# Patient Record
Sex: Male | Born: 1970 | Race: White | Hispanic: No | Smoking: Current some day smoker
Health system: Southern US, Community
[De-identification: ages and names within clinical notes are randomized; demographics above are authoritative.]

## PROBLEM LIST (undated history)

## (undated) DIAGNOSIS — E785 Hyperlipidemia, unspecified: Secondary | ICD-10-CM

## (undated) DIAGNOSIS — Z9889 Other specified postprocedural states: Secondary | ICD-10-CM

## (undated) HISTORY — DX: Other specified postprocedural states: Z98.890

## (undated) HISTORY — PX: OTHER SURGICAL HISTORY: SHX169

## (undated) HISTORY — PX: ABDOMINAL HERNIA REPAIR: SHX539

## (undated) HISTORY — DX: Hyperlipidemia, unspecified: E78.5

## (undated) HISTORY — PX: ACHILLES TENDON REPAIR: SUR1153

## (undated) HISTORY — PX: EYE SURGERY: SHX253

---

## 1997-12-28 ENCOUNTER — Encounter: Admission: RE | Admit: 1997-12-28 | Discharge: 1997-12-28 | Payer: Self-pay | Admitting: Family Medicine

## 1999-09-27 ENCOUNTER — Encounter: Admission: RE | Admit: 1999-09-27 | Discharge: 1999-09-27 | Payer: Self-pay | Admitting: Family Medicine

## 2000-02-29 ENCOUNTER — Encounter: Admission: RE | Admit: 2000-02-29 | Discharge: 2000-02-29 | Payer: Self-pay | Admitting: Family Medicine

## 2000-03-28 ENCOUNTER — Encounter: Admission: RE | Admit: 2000-03-28 | Discharge: 2000-03-28 | Payer: Self-pay | Admitting: Family Medicine

## 2000-04-21 ENCOUNTER — Encounter: Admission: RE | Admit: 2000-04-21 | Discharge: 2000-04-21 | Payer: Self-pay | Admitting: Family Medicine

## 2002-02-05 ENCOUNTER — Encounter: Admission: RE | Admit: 2002-02-05 | Discharge: 2002-02-05 | Payer: Self-pay | Admitting: Family Medicine

## 2002-04-06 ENCOUNTER — Encounter: Admission: RE | Admit: 2002-04-06 | Discharge: 2002-04-06 | Payer: Self-pay | Admitting: Family Medicine

## 2002-04-08 ENCOUNTER — Encounter: Admission: RE | Admit: 2002-04-08 | Discharge: 2002-04-08 | Payer: Self-pay | Admitting: Sports Medicine

## 2005-12-09 ENCOUNTER — Ambulatory Visit: Payer: Self-pay | Admitting: Family Medicine

## 2006-04-22 HISTORY — PX: EYE SURGERY: SHX253

## 2006-06-19 DIAGNOSIS — Z87891 Personal history of nicotine dependence: Secondary | ICD-10-CM

## 2011-12-10 DIAGNOSIS — Z9889 Other specified postprocedural states: Secondary | ICD-10-CM

## 2011-12-10 HISTORY — DX: Other specified postprocedural states: Z98.890

## 2011-12-31 ENCOUNTER — Encounter (INDEPENDENT_AMBULATORY_CARE_PROVIDER_SITE_OTHER): Payer: Self-pay | Admitting: Surgery

## 2012-01-02 ENCOUNTER — Encounter (INDEPENDENT_AMBULATORY_CARE_PROVIDER_SITE_OTHER): Payer: Self-pay | Admitting: Surgery

## 2012-01-02 ENCOUNTER — Ambulatory Visit (INDEPENDENT_AMBULATORY_CARE_PROVIDER_SITE_OTHER): Payer: BC Managed Care – PPO | Admitting: Surgery

## 2012-01-02 VITALS — BP 127/79 | HR 76 | Temp 98.2°F | Resp 18 | Ht 75.0 in | Wt 231.4 lb

## 2012-01-02 DIAGNOSIS — K429 Umbilical hernia without obstruction or gangrene: Secondary | ICD-10-CM | POA: Insufficient documentation

## 2012-01-02 NOTE — Patient Instructions (Signed)
Call our surgery schedulers at [phone] about a month before you are ready to schedule your surgery.

## 2012-01-02 NOTE — Progress Notes (Signed)
Patient ID: Gregg Church, male   DOB: 1970/08/03, 41 y.o.   MRN: [id number]  Chief Complaint  Patient presents with  . Hernia    HPI Gregg Church is a 41 y.o. male.  Referred by Dr. Halford Decamp for evaluation of small ventral hernia. HPI This is a 41 year old male in good health who recently noticed a bulge protruding at the upper edge of his umbilicus. It occasionally becomes uncomfortable. It remains reducible. He comes in today for surgical evaluation. He denies any GI obstructive symptoms. Past Medical History  Diagnosis Date  . H/O colonoscopy 12/10/2011  . Hyperlipidemia     Past Surgical History  Procedure Date  . Eye surgery 2008  . Eye surgery   Achilles tendon repair  History reviewed. No pertinent family history. Umbilical hernia in his father  Social History History  Substance Use Topics  . Smoking status: Current Every Day Smoker  . Smokeless tobacco: Not on file  . Alcohol Use: Yes     3 drinks per day    No Known Allergies  Current Outpatient Prescriptions  Medication Sig Dispense Refill  . aspirin 81 MG tablet Take 81 mg by mouth daily.        Review of Systems Review of Systems  Constitutional: Negative for fever, chills and unexpected weight change.  HENT: Negative for hearing loss, congestion, sore throat, trouble swallowing and voice change.   Eyes: Negative for visual disturbance.  Respiratory: Negative for cough and wheezing.   Cardiovascular: Negative for chest pain, palpitations and leg swelling.  Gastrointestinal: Negative for nausea, vomiting, abdominal pain, diarrhea, constipation, blood in stool, abdominal distention, anal bleeding and rectal pain.  Genitourinary: Negative for hematuria and difficulty urinating.  Musculoskeletal: Negative for arthralgias.  Skin: Negative for rash and wound.  Neurological: Negative for seizures, syncope, weakness and headaches.  Hematological: Negative for adenopathy. Does not bruise/bleed  easily.  Psychiatric/Behavioral: Negative for confusion.    Blood pressure 127/79, pulse 76, temperature 98.2 F (36.8 C), temperature source Temporal, resp. rate 18, height 6\' 3"  (1.905 m), weight 231 lb 6.4 oz (104.962 kg).  Physical Exam Physical Exam WDWN in NAD HEENT:  EOMI, sclera anicteric Neck:  No masses, no thyromegaly Lungs:  CTA bilaterally; normal respiratory effort CV:  Regular rate and rhythm; no murmurs Abd:  +bowel sounds, soft, palpable mass above the umbilicus - 3 cm; reducible; enlarges with Valsalva Ext:  Well-perfused; no edema Skin:  Warm, dry; no sign of jaundice  Data Reviewed none  Assessment    Umbilical hernia - reducible    Plan    Umbilical hernia repair with mesh. The surgical procedure has been discussed with the patient.  Potential risks, benefits, alternative treatments, and expected outcomes have been explained.  All of the patient's questions at this time have been answered.  The likelihood of reaching the patient's treatment goal is good.  The patient understand the proposed surgical procedure and wishes to proceed.        Alastair Hennes K. 01/02/2012, 9:39 AM

## 2012-01-14 ENCOUNTER — Encounter (INDEPENDENT_AMBULATORY_CARE_PROVIDER_SITE_OTHER): Payer: Self-pay

## 2012-04-07 ENCOUNTER — Encounter (INDEPENDENT_AMBULATORY_CARE_PROVIDER_SITE_OTHER): Payer: Self-pay

## 2015-01-31 ENCOUNTER — Ambulatory Visit: Payer: Self-pay | Admitting: Surgery

## 2015-01-31 NOTE — H&P (Signed)
History of Present Illness Gregg Church. Adahlia Stembridge MD; 01/31/2015 4:58 PM) The patient is a 44 year old male who presents with an umbilical hernia. Referred by Lang Snow for evaluation of umbilical hernia  This is a 44 yo male who was initially evaluated in 0814 for this umbilical hernia. He noticed a bulge protruding at the upper edge of his umbilicus. It occasionally becomes uncomfortable. It remains reducible. He comes in today for surgical evaluation. He denies any GI obstructive symptoms.   At that time we had recommended umbilical hernia repair with mesh. However the patient chose not to have it done at that time. It has enlarged and becomes more uncomfortable.  Other Problems Elbert Ewings, CMA; 48/18/5631 4:97 PM) Umbilical Hernia Repair  Past Surgical History Elbert Ewings, CMA; 01/31/2015 4:00 PM) Foot Surgery Left.  Diagnostic Studies History Elbert Ewings, [place]; 01/31/2015 4:00 PM) Colonoscopy never  Allergies Elbert Ewings, CMA; 01/31/2015 4:01 PM) No Known Drug Allergies 01/31/2015  Medication History Elbert Ewings, [place]; 01/31/2015 4:01 PM) No Current Medications Medications Reconciled  Social History Elbert Ewings, CMA; 01/31/2015 4:00 PM) Alcohol use Occasional alcohol use. Caffeine use Carbonated beverages, Coffee, Tea. No drug use Tobacco use Former smoker.  Family History Elbert Ewings, [place]; 01/31/2015 4:00 PM) Heart disease in male family member before age 45 Hypertension Father.     Review of Systems Elbert Ewings CMA; 01/31/2015 4:00 PM) General Not Present- Appetite Loss, Chills, Fatigue, Fever, Night Sweats, Weight Gain and Weight Loss. Skin Not Present- Change in Wart/Mole, Dryness, Hives, Jaundice, New Lesions, Non-Healing Wounds, Rash and Ulcer. HEENT Present- Wears glasses/contact lenses. Not Present- Earache, Hearing Loss, Hoarseness, Nose Bleed, Oral Ulcers, Ringing in the Ears, Seasonal Allergies, Sinus Pain, Sore Throat, Visual Disturbances  and Yellow Eyes. Respiratory Not Present- Bloody sputum, Chronic Cough, Difficulty Breathing, Snoring and Wheezing. Breast Not Present- Breast Mass, Breast Pain, Nipple Discharge and Skin Changes. Cardiovascular Not Present- Chest Pain, Difficulty Breathing Lying Down, Leg Cramps, Palpitations, Rapid Heart Rate, Shortness of Breath and Swelling of Extremities. Gastrointestinal Not Present- Abdominal Pain, Bloating, Bloody Stool, Change in Bowel Habits, Chronic diarrhea, Constipation, Difficulty Swallowing, Excessive gas, Gets full quickly at meals, Hemorrhoids, Indigestion, Nausea, Rectal Pain and Vomiting. Male Genitourinary Not Present- Blood in Urine, Change in Urinary Stream, Frequency, Impotence, Nocturia, Painful Urination, Urgency and Urine Leakage. Musculoskeletal Not Present- Back Pain, Joint Pain, Joint Stiffness, Muscle Pain, Muscle Weakness and Swelling of Extremities. Neurological Not Present- Decreased Memory, Fainting, Headaches, Numbness, Seizures, Tingling, Tremor, Trouble walking and Weakness. Psychiatric Not Present- Anxiety, Bipolar, Change in Sleep Pattern, Depression, Fearful and Frequent crying. Endocrine Not Present- Cold Intolerance, Excessive Hunger, Hair Changes, Heat Intolerance, Hot flashes and New Diabetes. Hematology Not Present- Easy Bruising, Excessive bleeding, Gland problems, HIV and Persistent Infections.  Vitals Elbert Ewings CMA; 01/31/2015 4:01 PM) 01/31/2015 4:01 PM Weight: 235 lb Height: 75in Body Surface Area: 2.37 m Body Mass Index: 29.37 kg/m Temp.: 97.87F(Temporal)  Pulse: 105 (Regular)  BP: 142/78 (Sitting, Left Arm, Standard)     Physical Exam Rodman Key K. Rolande Moe MD; 01/31/2015 4:59 PM)  The physical exam findings are as follows: Note:Physical Exam WDWN in NAD HEENT: EOMI, sclera anicteric Neck: No masses, no thyromegaly Lungs: CTA bilaterally; normal respiratory effort CV: Regular rate and rhythm; no murmurs Abd: +bowel  sounds, soft, palpable mass above the umbilicus - 4 cm; reducible; enlarges with Valsalva Ext: Well-perfused; no edema Skin: Warm, dry; no sign of jaundice    Assessment & Plan Rodman Key K. Shail Urbas MD; 01/31/2015 4:23  PM)  UMBILICAL HERNIA WITHOUT OBSTRUCTION AND WITHOUT GANGRENE (K42.9)  Current Plans Schedule for Surgery - umbilical hernia repair with mesh. The surgical procedure has been discussed with the patient. Potential risks, benefits, alternative treatments, and expected outcomes have been explained. All of the patient's questions at this time have been answered. The likelihood of reaching the patient's treatment goal is good. The patient understand the proposed surgical procedure and wishes to proceed.  Gregg Church. Georgette Dover, MD, [facility]  General/ Trauma Surgery  01/31/2015 5:00 PM

## 2017-01-09 DIAGNOSIS — Z0000 Encounter for general adult medical examination without abnormal findings: Secondary | ICD-10-CM | POA: Diagnosis not present

## 2017-01-09 DIAGNOSIS — E781 Pure hyperglyceridemia: Secondary | ICD-10-CM | POA: Diagnosis not present

## 2017-01-09 DIAGNOSIS — Z125 Encounter for screening for malignant neoplasm of prostate: Secondary | ICD-10-CM | POA: Diagnosis not present

## 2017-01-16 DIAGNOSIS — E781 Pure hyperglyceridemia: Secondary | ICD-10-CM | POA: Diagnosis not present

## 2017-01-16 DIAGNOSIS — Z0000 Encounter for general adult medical examination without abnormal findings: Secondary | ICD-10-CM | POA: Diagnosis not present

## 2017-01-16 DIAGNOSIS — Z23 Encounter for immunization: Secondary | ICD-10-CM | POA: Diagnosis not present

## 2017-01-16 DIAGNOSIS — R7301 Impaired fasting glucose: Secondary | ICD-10-CM | POA: Diagnosis not present

## 2017-01-16 DIAGNOSIS — Z1389 Encounter for screening for other disorder: Secondary | ICD-10-CM | POA: Diagnosis not present

## 2017-06-02 DIAGNOSIS — M7989 Other specified soft tissue disorders: Secondary | ICD-10-CM | POA: Diagnosis not present

## 2018-01-28 DIAGNOSIS — Z0000 Encounter for general adult medical examination without abnormal findings: Secondary | ICD-10-CM | POA: Diagnosis not present

## 2018-01-28 DIAGNOSIS — R82998 Other abnormal findings in urine: Secondary | ICD-10-CM | POA: Diagnosis not present

## 2018-02-04 DIAGNOSIS — Z1389 Encounter for screening for other disorder: Secondary | ICD-10-CM | POA: Diagnosis not present

## 2018-02-04 DIAGNOSIS — E781 Pure hyperglyceridemia: Secondary | ICD-10-CM | POA: Diagnosis not present

## 2018-02-04 DIAGNOSIS — E7849 Other hyperlipidemia: Secondary | ICD-10-CM | POA: Diagnosis not present

## 2018-02-04 DIAGNOSIS — R7301 Impaired fasting glucose: Secondary | ICD-10-CM | POA: Diagnosis not present

## 2018-02-04 DIAGNOSIS — Z0000 Encounter for general adult medical examination without abnormal findings: Secondary | ICD-10-CM | POA: Diagnosis not present

## 2018-02-16 DIAGNOSIS — Z23 Encounter for immunization: Secondary | ICD-10-CM | POA: Diagnosis not present

## 2018-05-28 DIAGNOSIS — H10413 Chronic giant papillary conjunctivitis, bilateral: Secondary | ICD-10-CM | POA: Diagnosis not present

## 2021-03-09 ENCOUNTER — Other Ambulatory Visit: Payer: Self-pay | Admitting: Internal Medicine

## 2021-03-09 DIAGNOSIS — E785 Hyperlipidemia, unspecified: Secondary | ICD-10-CM

## 2021-04-09 ENCOUNTER — Ambulatory Visit
Admission: RE | Admit: 2021-04-09 | Discharge: 2021-04-09 | Disposition: A | Discharge status: Home / Self Care | Payer: No Typology Code available for payment source | Source: Ambulatory Visit | Attending: Internal Medicine | Admitting: Internal Medicine

## 2021-04-09 DIAGNOSIS — E785 Hyperlipidemia, unspecified: Secondary | ICD-10-CM

## 2021-07-16 ENCOUNTER — Ambulatory Visit (AMBULATORY_SURGERY_CENTER): Payer: Self-pay

## 2021-07-16 ENCOUNTER — Other Ambulatory Visit: Payer: Self-pay

## 2021-07-16 VITALS — Ht 75.0 in | Wt 220.0 lb

## 2021-07-16 DIAGNOSIS — Z1211 Encounter for screening for malignant neoplasm of colon: Secondary | ICD-10-CM

## 2021-07-16 MED ORDER — PLENVU 140 G PO SOLR: 1.0000 | Freq: Once | ORAL | 0 refills | Status: AC

## 2021-07-16 NOTE — Progress Notes (Signed)
Denies allergies to eggs or soy products. Denies complication of anesthesia or sedation. Denies use of weight loss medication. Denies use of O2.   Emmi instructions given for colonoscopy.  

## 2021-08-17 ENCOUNTER — Encounter: Payer: Self-pay | Admitting: Gastroenterology

## 2021-08-29 ENCOUNTER — Ambulatory Visit (AMBULATORY_SURGERY_CENTER): Payer: 59 | Admitting: Gastroenterology

## 2021-08-29 ENCOUNTER — Encounter: Payer: Self-pay | Admitting: Gastroenterology

## 2021-08-29 VITALS — BP 114/68 | HR 82 | Temp 97.1°F | Resp 14 | Ht 75.0 in | Wt 220.0 lb

## 2021-08-29 DIAGNOSIS — Z1211 Encounter for screening for malignant neoplasm of colon: Secondary | ICD-10-CM

## 2021-08-29 DIAGNOSIS — D123 Benign neoplasm of transverse colon: Secondary | ICD-10-CM

## 2021-08-29 DIAGNOSIS — D127 Benign neoplasm of rectosigmoid junction: Secondary | ICD-10-CM

## 2021-08-29 DIAGNOSIS — K635 Polyp of colon: Secondary | ICD-10-CM

## 2021-08-29 MED ORDER — SODIUM CHLORIDE 0.9 % IV SOLN: 500.0000 mL | Freq: Once | INTRAVENOUS | Status: DC
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
## 2021-08-29 NOTE — Progress Notes (Signed)
Called to room to assist during endoscopic procedure.  Patient ID and intended procedure confirmed with present staff. Received instructions for my participation in the procedure from the performing physician.  

## 2021-08-29 NOTE — Progress Notes (Signed)
PT taken to PACU. Monitors in place. VSS. Report given to RN. 

## 2021-08-29 NOTE — Progress Notes (Signed)
VS completed by DT.  Pt's states no medical or surgical changes since previsit or office visit.  

## 2021-08-29 NOTE — Patient Instructions (Signed)
Handout on polyps, diverticulosis and Hemorrhoids provided  ? ?Await pathology results.  ? ?Continue current medications.  ? ?YOU HAD AN ENDOSCOPIC PROCEDURE TODAY AT [facility]:   Refer to the procedure report that was given to you for any specific questions about what was found during the examination.  If the procedure report does not answer your questions, please call your gastroenterologist to clarify.  If you requested that your care partner not be given the details of your procedure findings, then the procedure report has been included in a sealed envelope for you to review at your convenience later. ? ?YOU SHOULD EXPECT: Some feelings of bloating in the abdomen. Passage of more gas than usual.  Walking can help get rid of the air that was put into your GI tract during the procedure and reduce the bloating. If you had a lower endoscopy (such as a colonoscopy or flexible sigmoidoscopy) you may notice spotting of blood in your stool or on the toilet paper. If you underwent a bowel prep for your procedure, you may not have a normal bowel movement for a few days. ? ?Please Note:  You might notice some irritation and congestion in your nose or some drainage.  This is from the oxygen used during your procedure.  There is no need for concern and it should clear up in a day or so. ? ?SYMPTOMS TO REPORT IMMEDIATELY: ? ?Following lower endoscopy (colonoscopy or flexible sigmoidoscopy): ? Excessive amounts of blood in the stool ? Significant tenderness or worsening of abdominal pains ? Swelling of the abdomen that is new, acute ? Fever of 100?F or higher ? ?For urgent or emergent issues, a gastroenterologist can be reached at any hour by calling [phone]. ?Do not use MyChart messaging for urgent concerns.  ? ? ?DIET:  We do recommend a small meal at first, but then you may proceed to your regular diet.  Drink plenty of fluids but you should avoid alcoholic beverages for 24 hours. ? ?ACTIVITY:  You  should plan to take it easy for the rest of today and you should NOT DRIVE or use heavy machinery until tomorrow (because of the sedation medicines used during the test).   ? ?FOLLOW UP: ?Our staff will call the number listed on your records 48-72 hours following your procedure to check on you and address any questions or concerns that you may have regarding the information given to you following your procedure. If we do not reach you, we will leave a message.  We will attempt to reach you two times.  During this call, we will ask if you have developed any symptoms of COVID 19. If you develop any symptoms (ie: fever, flu-like symptoms, shortness of breath, cough etc.) before then, please call [phone].  If you test positive for Covid 19 in the 2 weeks post procedure, please call and report this information to [place].   ? ?If any biopsies were taken you will be contacted by phone or by letter within the next 1-3 weeks.  Please call us at [phone] if you have not heard about the biopsies in 3 weeks.  ? ? ?SIGNATURES/CONFIDENTIALITY: ?You and/or your care partner have signed paperwork which will be entered into your electronic medical record.  These signatures attest to the fact that that the information above on your After Visit Summary has been reviewed and is understood.  Full responsibility of the confidentiality of this discharge information lies with you and/or your care-partner. ? ? ?

## 2021-08-29 NOTE — Op Note (Signed)
[address] ?Patient Name: Gregg Church ?Procedure Date: 08/29/2021 8:13 AM ?MRN: [id number] ?Endoscopist: Carlota Raspberry. Havery Moros , MD ?Age: 51 ?Referring MD:  ?Date of Birth: [birth year] ?Gender: Male ?Account #: [id number] ?Procedure:                Colonoscopy ?Indications:              Screening for colorectal malignant neoplasm ?Medicines:                Monitored Anesthesia Care ?Procedure:                Pre-Anesthesia Assessment: ?                          - Prior to the procedure, a History and Physical  ?                          was performed, and patient medications and  ?                          allergies were reviewed. The patient's tolerance of  ?                          previous anesthesia was also reviewed. The risks  ?                          and benefits of the procedure and the sedation  ?                          options and risks were discussed with the patient.  ?                          All questions were answered, and informed consent  ?                          was obtained. Prior Anticoagulants: The patient has  ?                          taken no previous anticoagulant or antiplatelet  ?                          agents. ASA Grade Assessment: II - A patient with  ?                          mild systemic disease. After reviewing the risks  ?                          and benefits, the patient was deemed in  ?                          satisfactory condition to undergo the procedure. ?                          After obtaining informed consent, the colonoscope  ?  was passed under direct vision. Throughout the  ?                          procedure, the patient's blood pressure, pulse, and  ?                          oxygen saturations were monitored continuously. The  ?                          CF [id number] #[id number] was introduced through the anus  ?                          and advanced to the the cecum, identified by  ?                          appendiceal  orifice and ileocecal valve. The  ?                          colonoscopy was performed without difficulty. The  ?                          patient tolerated the procedure well. The quality  ?                          of the bowel preparation was good. The ileocecal  ?                          valve, appendiceal orifice, and rectum were  ?                          photographed. ?Scope In: 8:48:20 AM ?Scope Out: 9:11:46 AM ?Scope Withdrawal Time: 0 hours 20 minutes 56 seconds  ?Total Procedure Duration: 0 hours 23 minutes 26 seconds  ?Findings:                 The perianal and digital rectal examinations were  ?                          normal. ?                          A diminutive polyp was found in the transverse  ?                          colon. The polyp was sessile. The polyp was removed  ?                          with a cold snare. Resection and retrieval were  ?                          complete. ?                          A 3 mm polyp was found in the recto-sigmoid colon.  ?  The polyp was sessile. The polyp was removed with a  ?                          cold snare. Resection and retrieval were complete. ?                          A few small-mouthed diverticula were found in the  ?                          sigmoid colon. ?                          Internal hemorrhoids were found during retroflexion. ?                          The exam was otherwise without abnormality. ?Complications:            No immediate complications. Estimated blood loss:  ?                          Minimal. ?Estimated Blood Loss:     Estimated blood loss was minimal. ?Impression:               - One diminutive polyp in the transverse colon,  ?                          removed with a cold snare. Resected and retrieved. ?                          - One 3 mm polyp at the recto-sigmoid colon,  ?                          removed with a cold snare. Resected and retrieved. ?                          -  Diverticulosis in the sigmoid colon. ?                          - Internal hemorrhoids. ?                          - The examination was otherwise normal. ?Recommendation:           - Patient has a contact number available for  ?                          emergencies. The signs and symptoms of potential  ?                          delayed complications were discussed with the  ?                          patient. Return to normal activities tomorrow.  ?                          Written discharge instructions  were provided to the  ?                          patient. ?                          - Resume previous diet. ?                          - Continue present medications. ?                          - Await pathology results with further  ?                          recommendations ?Remo Lipps P. Hildred Mollica, MD ?08/29/2021 9:16:00 AM ?This report has been signed electronically. ?

## 2021-08-31 ENCOUNTER — Telehealth: Payer: Self-pay

## 2021-08-31 NOTE — Telephone Encounter (Signed)
Left message on follow up call. 

## 2022-11-20 IMAGING — CT CT CARDIAC CORONARY ARTERY CALCIUM SCORE
3 series · 14 of 20 positions shown, 16 images · non-contrast
Comparison: None.

CLINICAL DATA: Hyperlipidemia

EXAM:
CT CARDIAC CORONARY ARTERY CALCIUM SCORE
TECHNIQUE: Non-contrast imaging through the heart was performed using
prospective ECG gating. Image post processing was performed on an
independent workstation, allowing for quantitative analysis of the
heart and coronary arteries. Note that this exam targets the heart
and the chest was not imaged in its entirety.

[Series 2: calcium scoring 2.00 qr36 bestdiast 71% hrt calciu · axial · 0.42mm/px · z∈[+1690,+1774]mm · 4 of 70 slices shown]
[im 14/70  vessel]
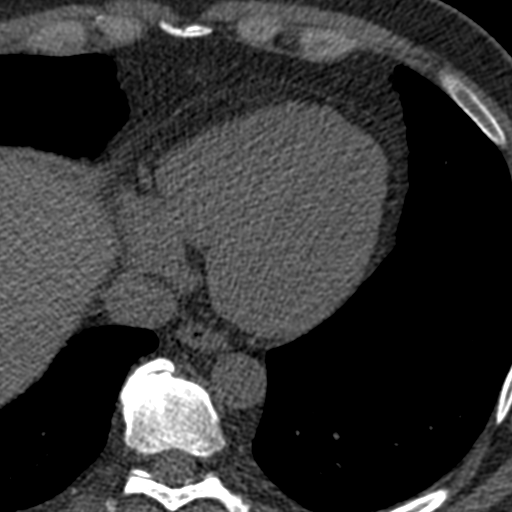
[im 28/70  vessel]
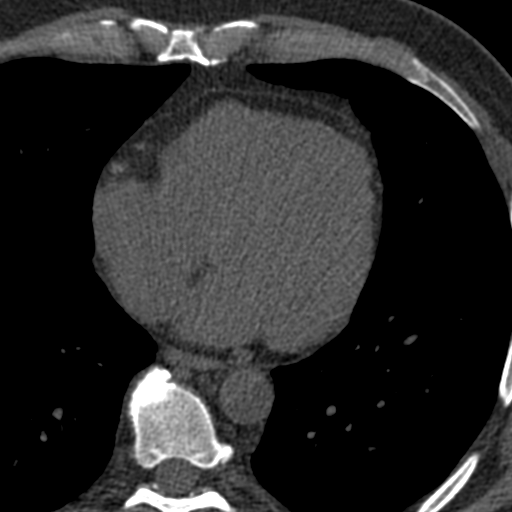
[im 42/70  vessel]
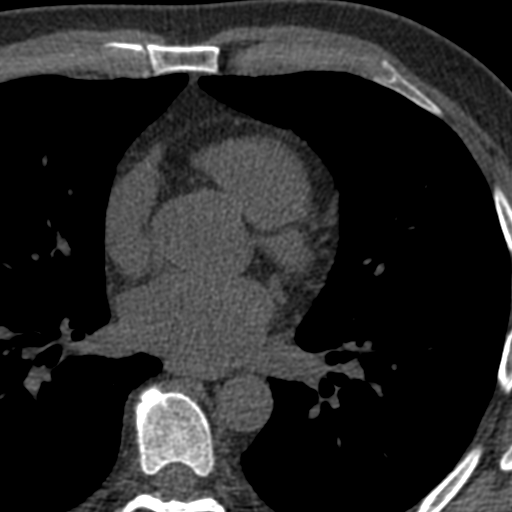
[im 56/70  vessel]
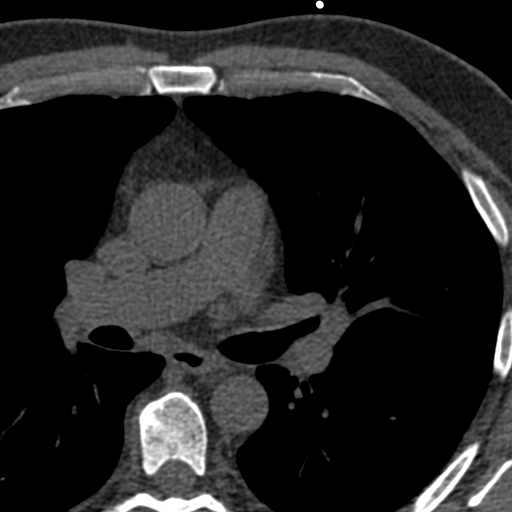

[Series 3: calcium scoring 2.00 br40 bestdiast 71% axial · axial · 0.60mm/px · z∈[+1686,+1778]mm · 5 of 70 slices shown, 7 images]
[im 12/70  vessel]
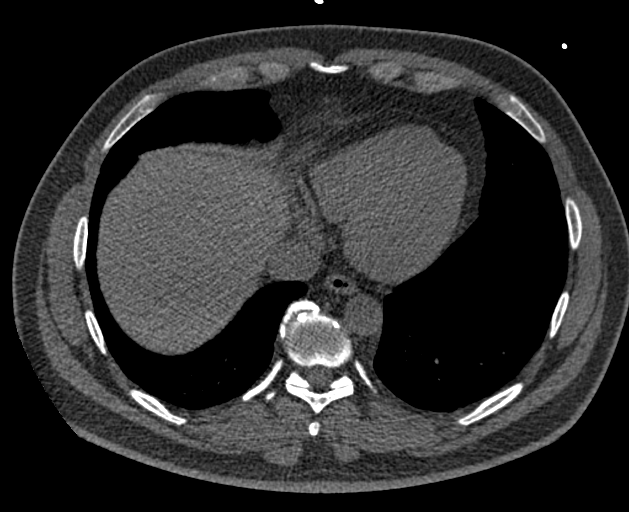
[im 12/70  lung]
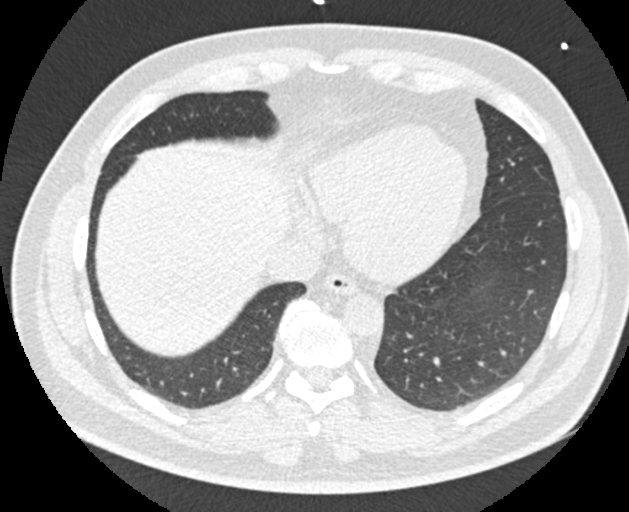
[im 24/70  vessel]
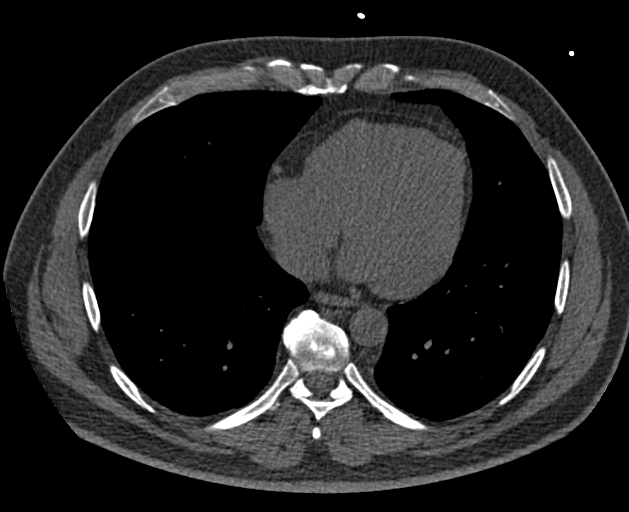
[im 35/70  vessel]
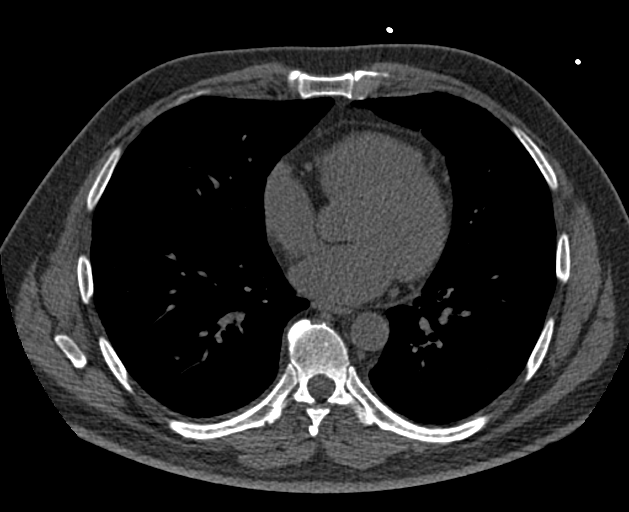
[im 47/70  vessel]
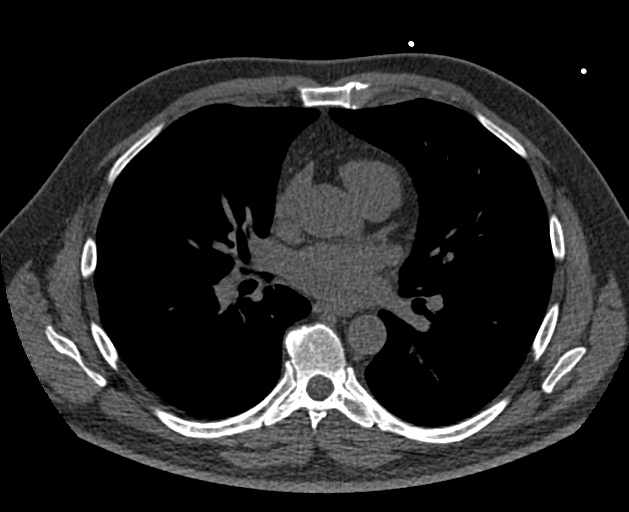
[im 58/70  vessel]
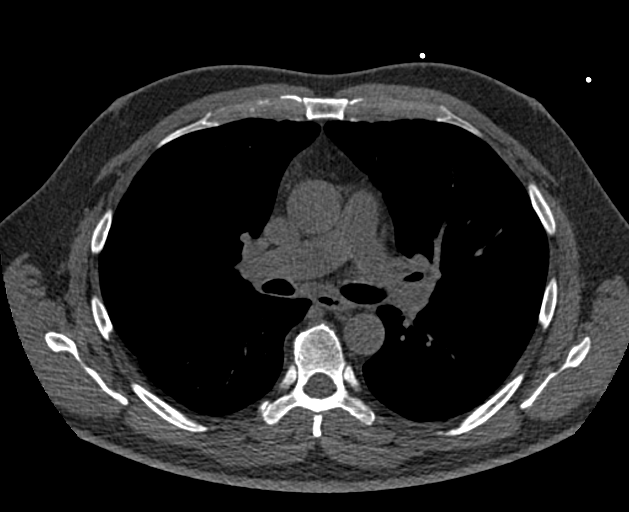
[im 58/70  lung]
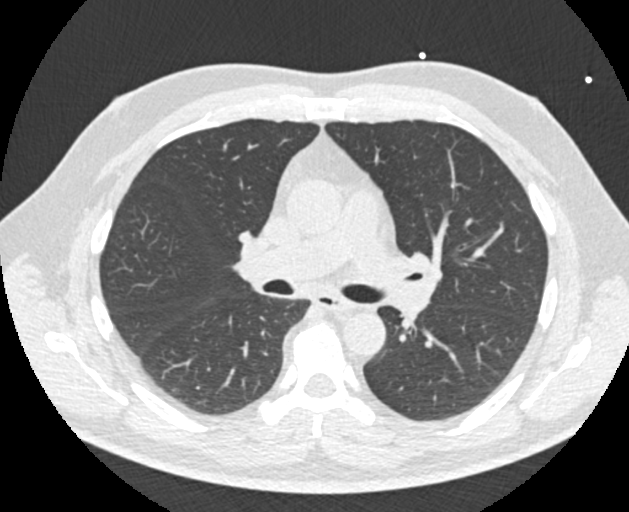

[Series 9: calcium scoring 2.00 br60 bestdiast 71% lungs · axial · 0.60mm/px · z∈[+1686,+1778]mm · 5 of 70 slices shown]
[im 12/70  vessel]
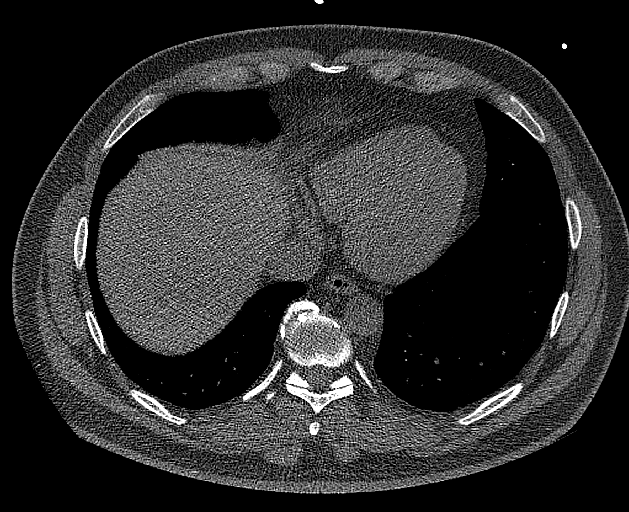
[im 24/70  vessel]
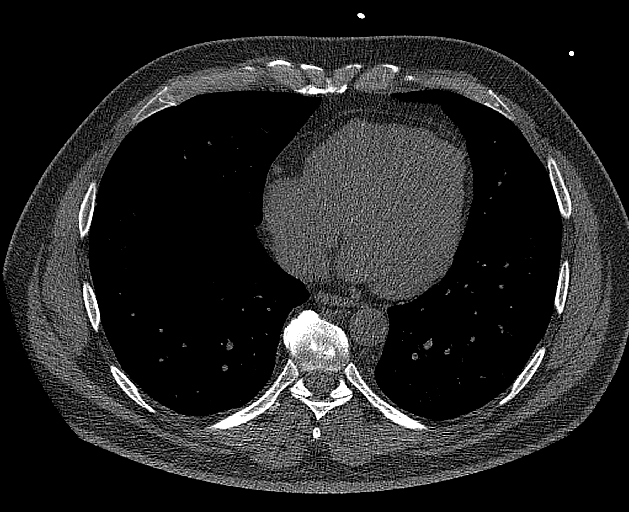
[im 35/70  vessel]
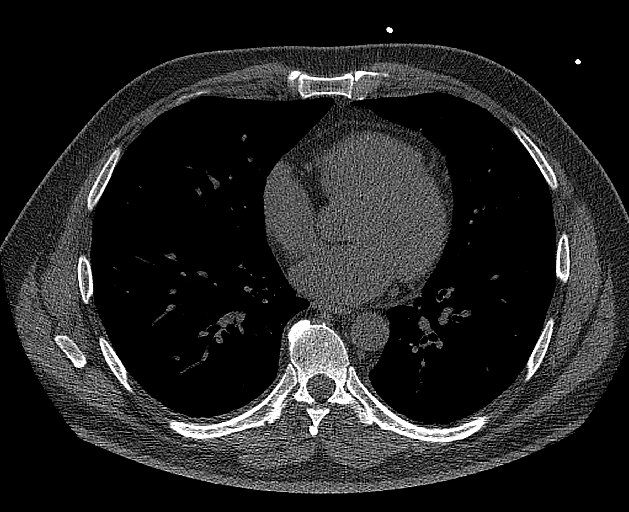
[im 47/70  vessel]
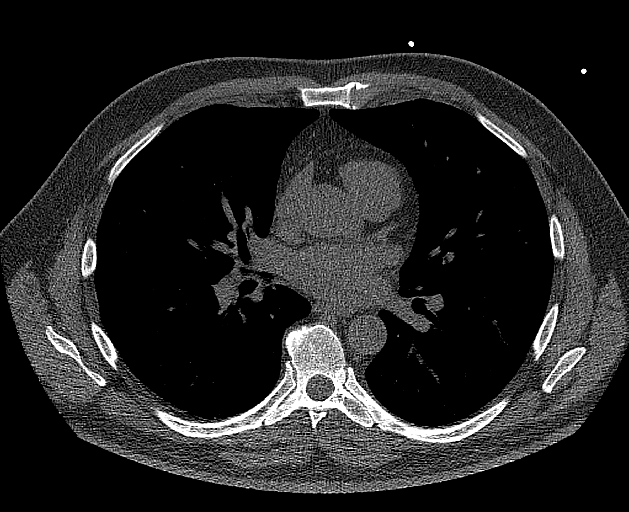
[im 58/70  vessel]
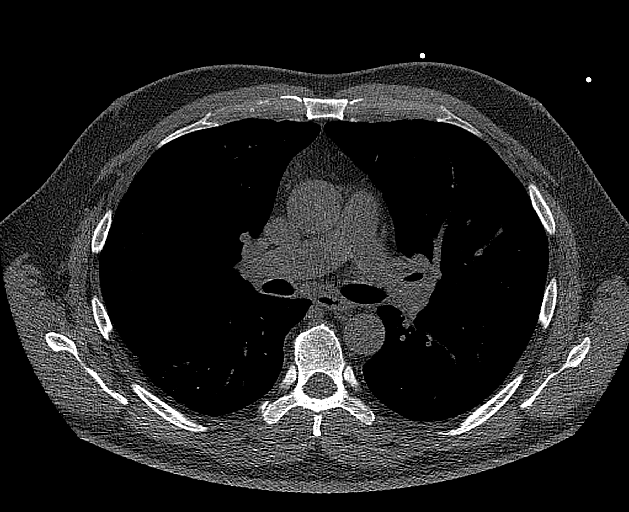

[14 of 20 positions shown; findings below may reference images not displayed]

FINDINGS: CORONARY CALCIUM SCORES:

Left Main: 0

LAD: 0

LCx: 0

RCA: 0

Total Agatston Score: 0

[HOSPITAL] percentile: 0

AORTA MEASUREMENTS:

Ascending Aorta: 28 mm

Descending Aorta: 24 mm

OTHER FINDINGS:

Heart is normal size. Aorta normal caliber. Scattered calcifications
in the aortic arch. No adenopathy. No confluent opacities or
effusions. Imaging into the upper abdomen demonstrates 11 mm
low-density lesion within the left hepatic lobe, likely small cyst.
Chest wall soft tissues are unremarkable. No acute bony abnormality.
IMPRESSION: No visible coronary artery calcifications. Total coronary calcium
score of 0.

No acute or significant extracardiac abnormality.
